# Patient Record
Sex: Male | Born: 1945 | Race: Black or African American | Hispanic: No | Marital: Married | Smoking: Never smoker
Health system: Southern US, Community
[De-identification: ages and names within clinical notes are randomized; demographics above are authoritative.]

## PROBLEM LIST (undated history)

## (undated) DIAGNOSIS — E119 Type 2 diabetes mellitus without complications: Secondary | ICD-10-CM

## (undated) DIAGNOSIS — I10 Essential (primary) hypertension: Secondary | ICD-10-CM

## (undated) DIAGNOSIS — M109 Gout, unspecified: Secondary | ICD-10-CM

## (undated) DIAGNOSIS — J45909 Unspecified asthma, uncomplicated: Secondary | ICD-10-CM

## (undated) DIAGNOSIS — I2510 Atherosclerotic heart disease of native coronary artery without angina pectoris: Secondary | ICD-10-CM

## (undated) DIAGNOSIS — M1990 Unspecified osteoarthritis, unspecified site: Secondary | ICD-10-CM

## (undated) HISTORY — PX: JOINT REPLACEMENT: SHX530

---

## 2013-07-11 ENCOUNTER — Encounter (HOSPITAL_COMMUNITY): Payer: Self-pay | Admitting: Emergency Medicine

## 2013-07-11 ENCOUNTER — Emergency Department (HOSPITAL_COMMUNITY)
Admission: EM | Admit: 2013-07-11 | Discharge: 2013-07-11 | Disposition: A | Discharge status: Home / Self Care | Payer: Medicare FFS | Attending: Emergency Medicine | Admitting: Emergency Medicine

## 2013-07-11 ENCOUNTER — Emergency Department (INDEPENDENT_AMBULATORY_CARE_PROVIDER_SITE_OTHER)
Admission: EM | Admit: 2013-07-11 | Discharge: 2013-07-11 | Disposition: A | Discharge status: Other Acute Inpatient Hospital | Payer: Medicare FFS | Source: Home / Self Care | Attending: Family Medicine | Admitting: Family Medicine

## 2013-07-11 ENCOUNTER — Emergency Department (INDEPENDENT_AMBULATORY_CARE_PROVIDER_SITE_OTHER): Payer: Medicare FFS

## 2013-07-11 DIAGNOSIS — J45901 Unspecified asthma with (acute) exacerbation: Secondary | ICD-10-CM

## 2013-07-11 DIAGNOSIS — E119 Type 2 diabetes mellitus without complications: Secondary | ICD-10-CM | POA: Insufficient documentation

## 2013-07-11 DIAGNOSIS — R0602 Shortness of breath: Secondary | ICD-10-CM

## 2013-07-11 DIAGNOSIS — Z7982 Long term (current) use of aspirin: Secondary | ICD-10-CM | POA: Insufficient documentation

## 2013-07-11 DIAGNOSIS — Z79899 Other long term (current) drug therapy: Secondary | ICD-10-CM | POA: Insufficient documentation

## 2013-07-11 DIAGNOSIS — Z794 Long term (current) use of insulin: Secondary | ICD-10-CM | POA: Insufficient documentation

## 2013-07-11 DIAGNOSIS — R609 Edema, unspecified: Secondary | ICD-10-CM | POA: Insufficient documentation

## 2013-07-11 DIAGNOSIS — I2510 Atherosclerotic heart disease of native coronary artery without angina pectoris: Secondary | ICD-10-CM | POA: Insufficient documentation

## 2013-07-11 DIAGNOSIS — Z792 Long term (current) use of antibiotics: Secondary | ICD-10-CM | POA: Insufficient documentation

## 2013-07-11 DIAGNOSIS — M109 Gout, unspecified: Secondary | ICD-10-CM | POA: Insufficient documentation

## 2013-07-11 DIAGNOSIS — I10 Essential (primary) hypertension: Secondary | ICD-10-CM | POA: Insufficient documentation

## 2013-07-11 DIAGNOSIS — R05 Cough: Secondary | ICD-10-CM

## 2013-07-11 DIAGNOSIS — Z880 Allergy status to penicillin: Secondary | ICD-10-CM | POA: Insufficient documentation

## 2013-07-11 DIAGNOSIS — IMO0002 Reserved for concepts with insufficient information to code with codable children: Secondary | ICD-10-CM | POA: Insufficient documentation

## 2013-07-11 DIAGNOSIS — R059 Cough, unspecified: Secondary | ICD-10-CM

## 2013-07-11 HISTORY — DX: Gout, unspecified: M10.9

## 2013-07-11 HISTORY — DX: Type 2 diabetes mellitus without complications: E11.9

## 2013-07-11 HISTORY — DX: Atherosclerotic heart disease of native coronary artery without angina pectoris: I25.10

## 2013-07-11 HISTORY — DX: Unspecified asthma, uncomplicated: J45.909

## 2013-07-11 HISTORY — DX: Unspecified osteoarthritis, unspecified site: M19.90

## 2013-07-11 HISTORY — DX: Essential (primary) hypertension: I10

## 2013-07-11 LAB — CBC
HCT: 42 % (ref 39.0–52.0)
Hemoglobin: 14.5 g/dL (ref 13.0–17.0)
MCH: 29.1 pg (ref 26.0–34.0)
MCHC: 34.5 g/dL (ref 30.0–36.0)
MCV: 84.3 fL (ref 78.0–100.0)
Platelets: 163 10*3/uL (ref 150–400)
RBC: 4.98 MIL/uL (ref 4.22–5.81)
RDW: 13.8 % (ref 11.5–15.5)
WBC: 9.9 10*3/uL (ref 4.0–10.5)

## 2013-07-11 LAB — BASIC METABOLIC PANEL
BUN: 12 mg/dL (ref 6–23)
CO2: 24 mEq/L (ref 19–32)
Calcium: 9.4 mg/dL (ref 8.4–10.5)
Chloride: 105 mEq/L (ref 96–112)
Creatinine, Ser: 1.27 mg/dL (ref 0.50–1.35)
GFR calc Af Amer: 66 mL/min — ABNORMAL LOW (ref >90)
GFR, EST NON AFRICAN AMERICAN: 57 mL/min — AB (ref >90)
Glucose, Bld: 161 mg/dL — ABNORMAL HIGH (ref 70–99)
Potassium: 3.8 mEq/L (ref 3.7–5.3)
SODIUM: 145 meq/L (ref 137–147)

## 2013-07-11 LAB — POCT I-STAT TROPONIN I: Troponin i, poc: 0 ng/mL (ref 0.00–0.08)

## 2013-07-11 LAB — PRO B NATRIURETIC PEPTIDE: Pro B Natriuretic peptide (BNP): 85.5 pg/mL (ref 0–125)

## 2013-07-11 MED ORDER — METHYLPREDNISOLONE SODIUM SUCC 125 MG IJ SOLR
125.0000 mg | Freq: Once | INTRAMUSCULAR | Status: AC
  Administered 2013-07-11: 125 mg via INTRAVENOUS
  Filled 2013-07-11: qty 2

## 2013-07-11 MED ORDER — IPRATROPIUM-ALBUTEROL 0.5-2.5 (3) MG/3ML IN SOLN
3.0000 mL | Freq: Once | RESPIRATORY_TRACT | Status: AC
  Administered 2013-07-11: 3 mL via RESPIRATORY_TRACT
  Filled 2013-07-11: qty 3

## 2013-07-11 MED ORDER — AZITHROMYCIN 250 MG PO TABS: 250.0000 mg | ORAL_TABLET | Freq: Every day | ORAL | Status: AC

## 2013-07-11 MED ORDER — ALBUTEROL SULFATE (2.5 MG/3ML) 0.083% IN NEBU
5.0000 mg | INHALATION_SOLUTION | Freq: Once | RESPIRATORY_TRACT | Status: AC
  Administered 2013-07-11: 5 mg via RESPIRATORY_TRACT

## 2013-07-11 MED ORDER — PREDNISONE 20 MG PO TABS: 40.0000 mg | ORAL_TABLET | Freq: Every day | ORAL | Status: AC

## 2013-07-11 MED ORDER — ALBUTEROL SULFATE (2.5 MG/3ML) 0.083% IN NEBU
INHALATION_SOLUTION | RESPIRATORY_TRACT | Status: AC
  Filled 2013-07-11: qty 3

## 2013-07-11 NOTE — Discharge Instructions (Signed)
Take medication as directed. Follow up with your PCP when you get back home. Return to ED if you develop worsening symptoms, Chest pain, or shortness of breath.

## 2013-07-11 NOTE — ED Notes (Signed)
PT  HAS  A  HISTORY   OF  CARDIAC  DISEASE  AND      ASTHMA        HE  IS  FROM [place]   AND    REPORTS  SYMPTOMS  OF  CHEST  TIGHTNESS       AND  SHORTNESS  OF  BREATH  WHICH  OCCURRED  LAST  PM             HIS  SKIN IS  WARM AND  DRY             CAP  REFILL IS  INTACT  SPEAKING IN  COMPLETE  SENTANCES

## 2013-07-11 NOTE — ED Provider Notes (Signed)
CSN: [id number]     Arrival date & time 07/11/13  1506 History   First MD Initiated Contact with Patient 07/11/13 1533     Chief Complaint  Patient presents with  . Shortness of Breath     (Consider location/radiation/quality/duration/timing/severity/associated sxs/prior Treatment) Patient is a 68 y.o. male presenting with shortness of breath.  Shortness of Breath Associated symptoms: cough, sore throat ("from cough") and wheezing   Associated symptoms: no abdominal pain, no chest pain, no fever, no headaches, no neck pain and no vomiting    68 yo AA male presents to ED from Urgent care. Patient from [place]. Flew about 2.5 hours to [place] on the 5th. Patient admits to a dry cough yesterday evening that seems to worsen in at night. Patient has a hx of asthma. Patient states cough has exacerbated his asthma and he admits to SOB with coughing fits. Admits to chronic DOE that has not worsened since his cough started. Admits to Orthopnea that started last night with the cough. Patient denies any chest pain or tightness currently. Patient admits to tightness with his coughing fits. Sxs improve with use of albuterol inhaler. Patient states his current symptoms are typical of his history of asthma exacerbations.  Admits to prior hospitalizations for asthma but none in the past year. Patient admits to one intubation > 10 years ago. Denies recent steroid use.  PMH significant for CAD, T2DM, HTN, and asthma.   Advanced age > 67 yo: Yes HTN: Yes Hyperlipidemia: Yes Cigarette smoking: No Diabetes Mellitus: Yes Family hx of CAD or MI < 67 yo : No Male or Post menopausal: Yes Cocaine use: No Prior MI: No CABG: No Stress test: Yes, 2 years ago and reported to be normal. Has another scheduled for 07/28/13 back home in [place]. Angina: No   Past Medical History  Diagnosis Date  . Diabetes mellitus without complication   . Hypertension   . CAD (coronary artery disease)   . Arthritis   . Gout   .  Asthma    Past Surgical History  Procedure Laterality Date  . Joint replacement     No family history on file. History  Substance Use Topics  . Smoking status: Never Smoker   . Smokeless tobacco: Not on file  . Alcohol Use: No    Review of Systems  Constitutional: Negative for fever, chills and unexpected weight change.  HENT: Positive for sore throat ("from cough"). Negative for congestion.   Respiratory: Positive for cough, chest tightness, shortness of breath and wheezing.   Cardiovascular: Positive for leg swelling. Negative for chest pain and palpitations.  Gastrointestinal: Negative for nausea, vomiting, abdominal pain, diarrhea and constipation.  Musculoskeletal: Negative for neck pain.  Neurological: Negative for dizziness, light-headedness and headaches.  All other systems reviewed and are negative.      Allergies  Penicillins and Shellfish allergy  Home Medications   Current Outpatient Rx  Name  Route  Sig  Dispense  Refill  . albuterol (PROVENTIL HFA;VENTOLIN HFA) 108 (90 BASE) MCG/ACT inhaler   Inhalation   Inhale 2 puffs into the lungs every 4 (four) hours as needed for shortness of breath.         Marland Kitchen. amLODipine (NORVASC) 10 MG tablet   Oral   Take 10 mg by mouth daily.         Marland Kitchen. aspirin 81 MG tablet   Oral   Take 81 mg by mouth daily.         . budesonide-formoterol (  SYMBICORT) 80-4.5 MCG/ACT inhaler   Inhalation   Inhale 2 puffs into the lungs 2 (two) times daily.         . colchicine 0.6 MG tablet   Oral   Take 0.6 mg by mouth 2 (two) times daily.         . Febuxostat (ULORIC) 80 MG TABS   Oral   Take 1 tablet by mouth daily.         . finasteride (PROSCAR) 5 MG tablet   Oral   Take 5 mg by mouth daily.         . furosemide (LASIX) 20 MG tablet   Oral   Take 20 mg by mouth daily as needed for fluid or edema.         Marland Kitchen glyBURIDE (DIABETA) 5 MG tablet   Oral   Take 10 mg by mouth 2 (two) times daily with a meal.          . insulin glargine (LANTUS) 100 UNIT/ML injection   Subcutaneous   Inject 25 Units into the skin every evening.          . insulin regular (NOVOLIN R,HUMULIN R) 100 units/mL injection   Subcutaneous   Inject 0-10 Units into the skin 3 (three) times daily before meals. Sliding scale         . metoprolol succinate (TOPROL-XL) 50 MG 24 hr tablet   Oral   Take 50 mg by mouth 2 (two) times daily. Take with or immediately following a meal.         . montelukast (SINGULAIR) 10 MG tablet   Oral   Take 10 mg by mouth at bedtime.         . sertraline (ZOLOFT) 20 MG/ML concentrated solution   Oral   Take by mouth 2 (two) times daily.          . simvastatin (ZOCOR) 20 MG tablet   Oral   Take 20 mg by mouth daily at 6 PM.         . theophylline (THEO-24) 300 MG 24 hr capsule   Oral   Take 300 mg by mouth daily.         . valsartan (DIOVAN) 160 MG tablet   Oral   Take 160 mg by mouth 2 (two) times daily.         Marland Kitchen azithromycin (ZITHROMAX Z-PAK) 250 MG tablet   Oral   Take 1 tablet (250 mg total) by mouth daily. 500mg  PO day 1, then 250mg  PO days 205   6 tablet   0   . predniSONE (DELTASONE) 20 MG tablet   Oral   Take 2 tablets (40 mg total) by mouth daily.   10 tablet   0    BP 174/91  Pulse 101  Temp(Src) 98.5 F (36.9 C) (Oral)  Resp 24  SpO2 94% Physical Exam  Nursing note and vitals reviewed. Constitutional: He is oriented to person, place, and time. He appears well-developed and well-nourished. No distress.  HENT:  Head: Normocephalic and atraumatic.  Nose: Nose normal.  Mouth/Throat: Uvula is midline, oropharynx is clear and moist and mucous membranes are normal. No oropharyngeal exudate, posterior oropharyngeal edema or posterior oropharyngeal erythema.  Eyes: Conjunctivae and EOM are normal.  Neck: Normal range of motion. Neck supple. No JVD present.  Cardiovascular: Normal rate and regular rhythm.  Exam reveals no gallop and no friction rub.    No murmur heard. Pulmonary/Chest: Effort normal. Not tachypneic. No  respiratory distress. He has no decreased breath sounds. He has wheezes. He has no rhonchi. He has no rales.  Musculoskeletal: Normal range of motion. He exhibits edema (mild 1+ bilateral pitting edema).  Neurological: He is alert and oriented to person, place, and time.  Skin: Skin is warm and dry. He is not diaphoretic.  Psychiatric: He has a normal mood and affect. His behavior is normal.    ED Course  Procedures (including critical care time) Labs Review Labs Reviewed  BASIC METABOLIC PANEL - Abnormal; Notable for the following:    Glucose, Bld 161 (*)    GFR calc non Af Amer 57 (*)    GFR calc Af Amer 66 (*)    All other components within normal limits  CBC  PRO B NATRIURETIC PEPTIDE  POCT I-STAT TROPONIN I   Imaging Review Dg Chest 2 View  07/11/2013   CLINICAL DATA:  Cough  EXAM: CHEST  2 VIEW  COMPARISON:  None.  FINDINGS: There is mild scarring in the left base. Elsewhere lungs are clear. Heart is upper normal in size with normal pulmonary vascularity. No adenopathy. There is degenerative change in the thoracic spine.  IMPRESSION: No edema or consolidation.  Mild scarring left base.   Electronically Signed   By: Bretta Bang M.D.   On: 07/11/2013 14:28    EKG Interpretation   None      Date: 07/11/2013  Rate: 101  Rhythm: sinus tachycardia  QRS Axis: normal  Intervals: PR prolonged  ST/T Wave abnormalities: nonspecific ST/T changes  Conduction Disutrbances:first-degree A-V block   Narrative Interpretation:   Old EKG Reviewed: none available    MDM   Final diagnoses:  Asthma exacerbation  Cough  BP elevated. Patient advised to get rechecked once he gets back home.  CXR show mild scarring in Left base, otherwise WNL.  Troponin negative EKG shows mild sinus tachycardia at 101 with 1st degree AV block and nonspecific ST/T changes. Suspect tachycardia related to breathing treatment.  BNP  negative. No Leukocytosis.  Patient afebrile. Patient in NAD. Speaking in full sentences with normal mental status. No labored breathing.  Pulse ox appears stable on RA at 98%-100% during exam Wheezing resolved with 2 breathing treatments.   Plan to start patient on prednisone burst and have him follow up with his PCP back home. Patient provided with Z-pack as well and advised to wait 2-3 days  to see if symptoms improve first.    Patient ambulated in hall without any noted SOB. Patient denies any worsening symptom with walking. States he feels much better after treatment. Patient states he has plenty left of his inhalers. Patient given strict return precautions should he develop worsening symptoms.   Meds given in ED:  Medications  albuterol (PROVENTIL) (2.5 MG/3ML) 0.083% nebulizer solution 5 mg (5 mg Nebulization Given 07/11/13 1523)  ipratropium-albuterol (DUONEB) 0.5-2.5 (3) MG/3ML nebulizer solution 3 mL (3 mLs Nebulization Given 07/11/13 1618)  methylPREDNISolone sodium succinate (SOLU-MEDROL) 125 mg/2 mL injection 125 mg (125 mg Intravenous Given 07/11/13 1618)    Discharge Medication List as of 07/11/2013  6:42 PM    START taking these medications   Details  azithromycin (ZITHROMAX Z-PAK) 250 MG tablet Take 1 tablet (250 mg total) by mouth daily. 500mg  PO day 1, then 250mg  PO days 205, Starting 07/11/2013, Until Discontinued, Print    predniSONE (DELTASONE) 20 MG tablet Take 2 tablets (40 mg total) by mouth daily., Starting 07/11/2013, Until Discontinued, Print  Rudene Anda, PA-C 07/12/13 [phone]

## 2013-07-11 NOTE — ED Notes (Signed)
Pt is visiting from [place] and is here with shortness of breath and tightness in chest.  Pt sent here because he has history of asthma and cad.  PT states cough, wheezing and sob

## 2013-07-11 NOTE — ED Notes (Signed)
Tech ambulated pt in hallway without difficulty outside of baseline. PA at bedside.

## 2013-07-11 NOTE — ED Provider Notes (Signed)
CSN: [id number]     Arrival date & time 07/11/13  1334 History   First MD Initiated Contact with Patient 07/11/13 1405     Chief Complaint  Patient presents with  . Shortness of Breath     (Consider location/radiation/quality/duration/timing/severity/associated sxs/prior Treatment) Patient is a 68 y.o. male presenting with shortness of breath. The history is provided by the patient.  Shortness of Breath Severity:  Moderate Onset quality:  Gradual Duration:  2 days Progression:  Worsening Chronicity:  Chronic Associated symptoms: cough and wheezing   Associated symptoms: no abdominal pain, no chest pain and no fever   Risk factors comment:  H/o asthma   Past Medical History  Diagnosis Date  . Diabetes mellitus without complication   . Hypertension   . CAD (coronary artery disease)   . Arthritis   . Gout   . Asthma    No past surgical history on file. History reviewed. No pertinent family history. History  Substance Use Topics  . Smoking status: Never Smoker   . Smokeless tobacco: Not on file  . Alcohol Use: No    Review of Systems  Constitutional: Negative.  Negative for fever.  HENT: Negative.   Respiratory: Positive for cough, shortness of breath and wheezing.   Cardiovascular: Negative for chest pain, palpitations and leg swelling.  Gastrointestinal: Negative.  Negative for abdominal pain.  Genitourinary: Negative.       Allergies  Review of patient's allergies indicates no known allergies.  Home Medications   Current Outpatient Rx  Name  Route  Sig  Dispense  Refill  . ALBUTEROL IN   Inhalation   Inhale into the lungs.         . AMLODIPINE BESYLATE PO   Oral   Take by mouth.         . ASPIRIN PO   Oral   Take by mouth.         . Budesonide-Formoterol Fumarate (SYMBICORT IN)   Inhalation   Inhale into the lungs.         . COLCHICINE PO   Oral   Take by mouth.         . Febuxostat (ULORIC PO)   Oral   Take by mouth.          Marland Kitchen. FINASTERIDE PO   Oral   Take by mouth.         . Furosemide (LASIX PO)   Oral   Take by mouth.         . GLYBURIDE PO   Oral   Take by mouth.         . Insulin Glargine (LANTUS [place])   Subcutaneous   Inject into the skin.         . Insulin Regular Human (HUMULIN R IJ)   Injection   Inject as directed.         Marland Kitchen. METOPROLOL TARTRATE PO   Oral   Take by mouth.         . Montelukast Sodium (SINGULAIR PO)   Oral   Take by mouth.         . Sertraline HCl (ZOLOFT PO)   Oral   Take by mouth.         . Simvastatin (ZOCOR PO)   Oral   Take by mouth.         . Theophylline (THEO-24 PO)   Oral   Take by mouth.         . TRAMADOL  HCL PO   Oral   Take by mouth.         . Valsartan (DIOVAN PO)   Oral   Take by mouth.          BP 155/80  Pulse 100  Temp(Src) 99.8 F (37.7 C) (Oral)  Resp 20  SpO2 100% Physical Exam  Nursing note and vitals reviewed. Constitutional: He is oriented to person, place, and time. He appears well-developed and well-nourished. No distress.  HENT:  Head: Normocephalic.  Right Ear: External ear normal.  Left Ear: External ear normal.  Mouth/Throat: Oropharynx is clear and moist.  Eyes: Pupils are equal, round, and reactive to light.  Neck: Normal range of motion. Neck supple.  Cardiovascular: Normal rate, regular rhythm, normal heart sounds and intact distal pulses.   Pulmonary/Chest: Effort normal. No respiratory distress. He has wheezes.  Abdominal: Soft. Bowel sounds are normal.  Neurological: He is alert and oriented to person, place, and time.  Skin: Skin is warm and dry.    ED Course  Procedures (including critical care time) Labs Review Labs Reviewed - No data to display Imaging Review Dg Chest 2 View  07/11/2013   CLINICAL DATA:  Cough  EXAM: CHEST  2 VIEW  COMPARISON:  None.  FINDINGS: There is mild scarring in the left base. Elsewhere lungs are clear. Heart is upper normal in size with normal  pulmonary vascularity. No adenopathy. There is degenerative change in the thoracic spine.  IMPRESSION: No edema or consolidation.  Mild scarring left base.   Electronically Signed   By: Bretta Bang M.D.   On: 07/11/2013 14:28   ecg- nsr. 1st degree av block, IRBBB, rate 96.  X-rays reviewed and report per radiologist.   MDM   Final diagnoses:  Shortness of breath at rest  sent for further eval of poss chf, sob, asthma.    Linna Hoff, MD 07/11/13 [phone]

## 2013-07-12 NOTE — ED Provider Notes (Signed)
Medical screening examination/treatment/procedure(s) were conducted as a shared visit with non-physician practitioner(s) and myself.  I personally evaluated the patient during the encounter.  EKG Interpretation   None       Pt with symptoms consistent with viral URI and asthma exacerbation.  After steroids and albuterol/atrovent hear wheezing resolved and pt feeling getter.  Well appearing here.   No signs of pharyngitis, otitis or abnormal abdominal findings.   CXR wnl and pt to return with any further problems.  Pt could ambulate without SOB.  D/ced home.   Gwyneth SproutWhitney Yatzary Merriweather, MD 07/12/13 1622

## 2015-03-09 IMAGING — CR DG CHEST 2V
2 series · 2 of 2 positions shown · non-contrast
Comparison: None.

CLINICAL DATA: Cough

EXAM:
CHEST  2 VIEW

[view not recorded (1 of 2)]
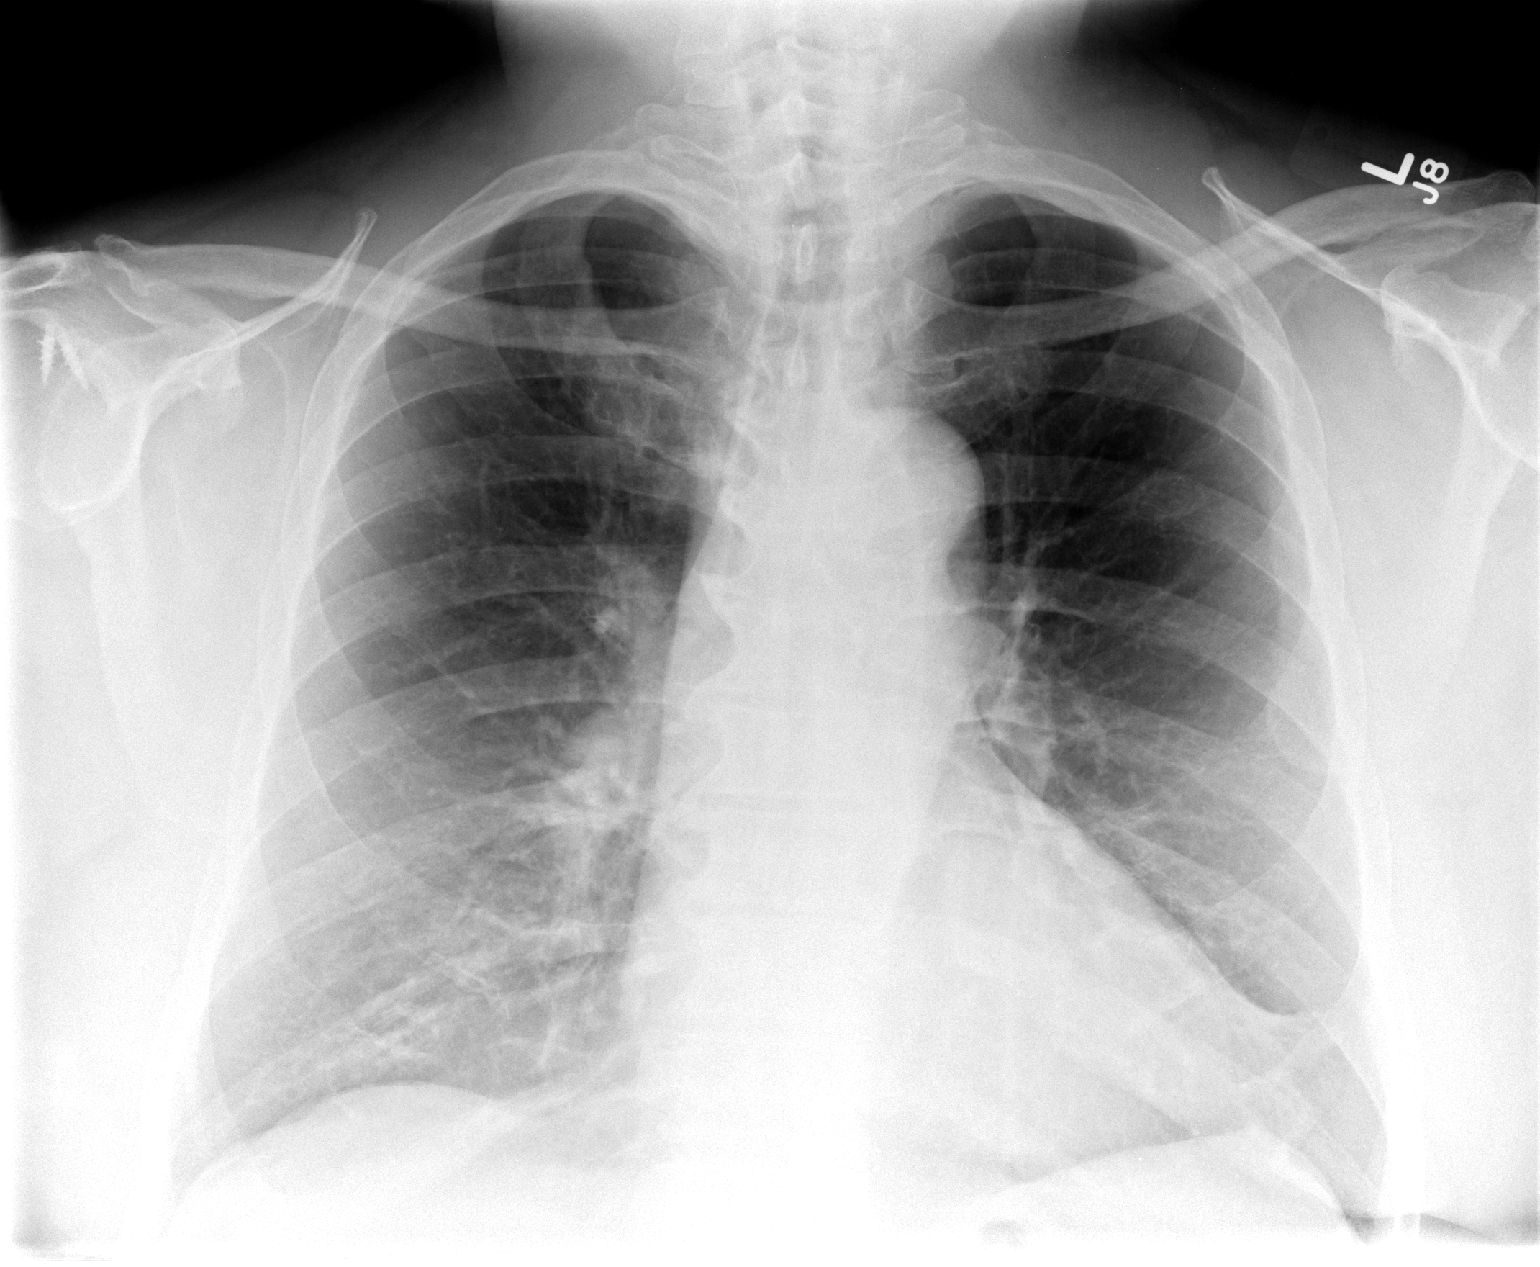

[view not recorded (2 of 2)]
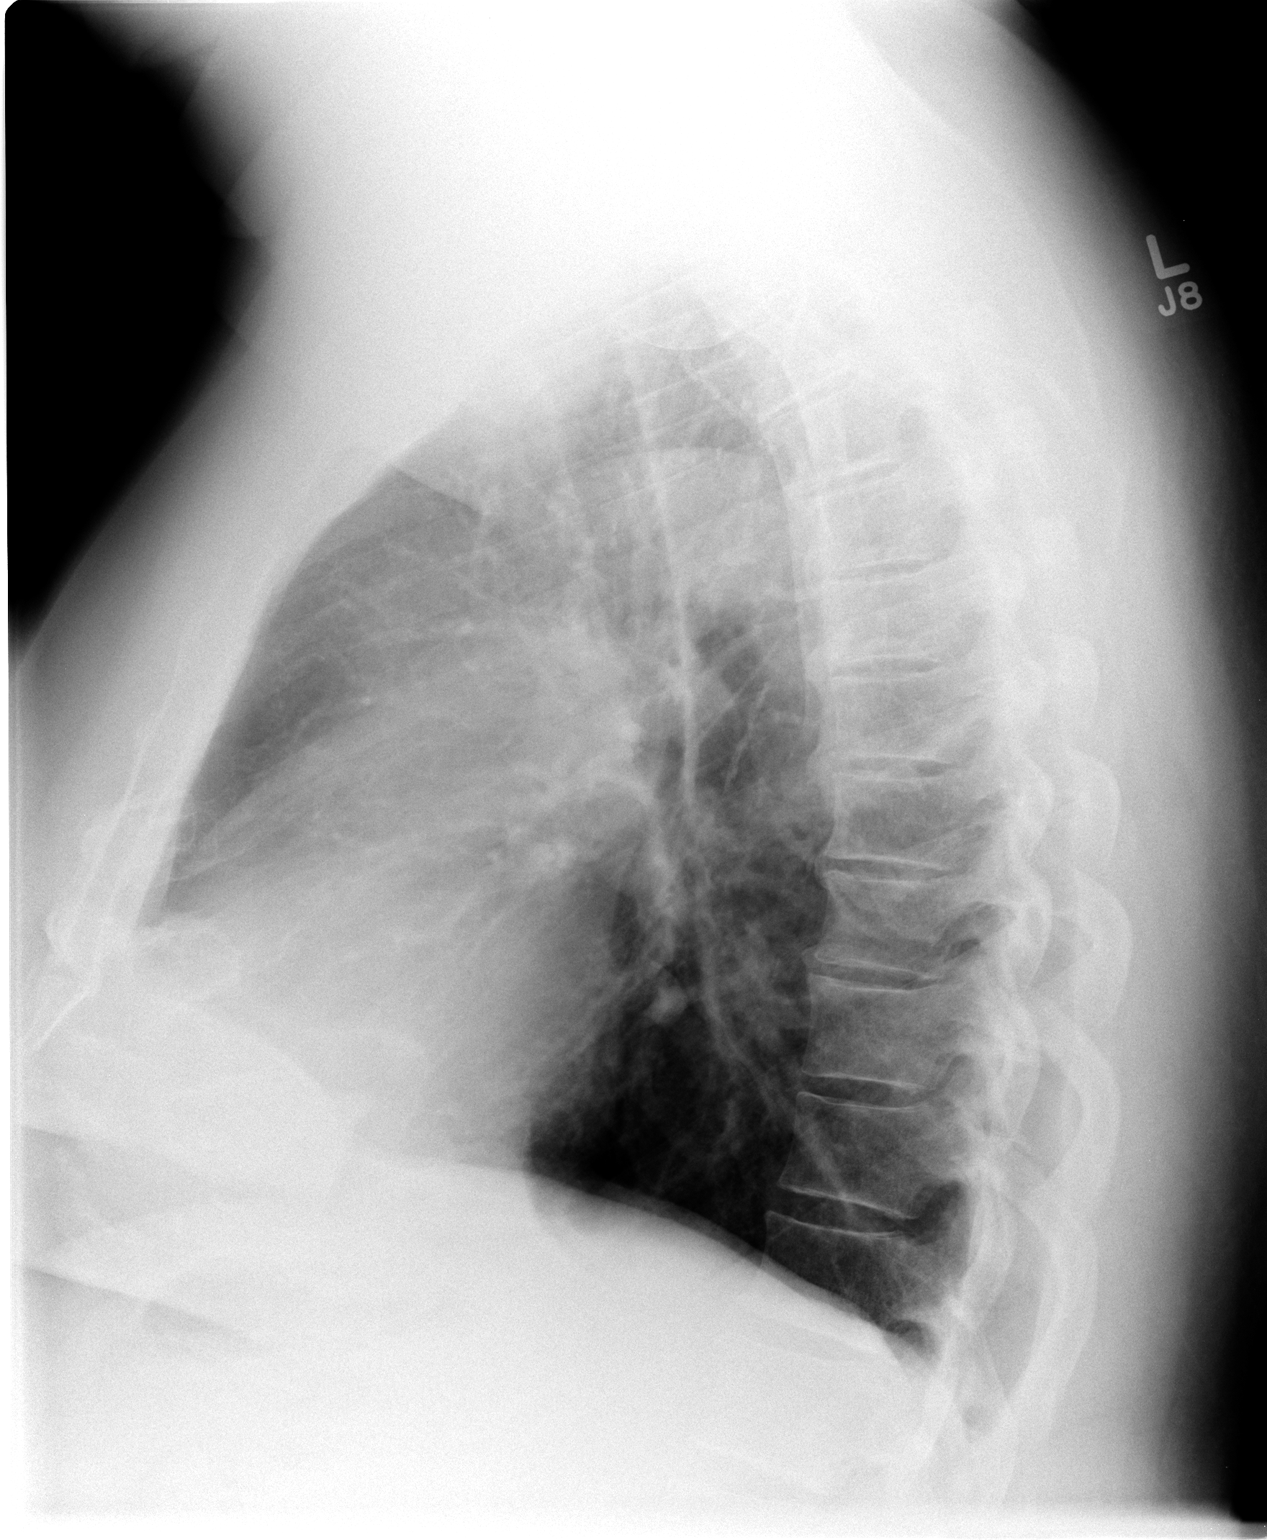

[2 of 2 positions shown; findings below may reference images not displayed]

FINDINGS: There is mild scarring in the left base. Elsewhere lungs are clear.
Heart is upper normal in size with normal pulmonary vascularity. No
adenopathy. There is degenerative change in the thoracic spine.
IMPRESSION: No edema or consolidation.  Mild scarring left base.
